# Patient Record
Sex: Female | Born: 1935 | Race: White | Hispanic: No | Marital: Married
Health system: Southern US, Community
[De-identification: ages and names within clinical notes are randomized; demographics above are authoritative.]

---

## 2005-04-18 ENCOUNTER — Inpatient Hospital Stay (HOSPITAL_COMMUNITY): Admission: AD | Admit: 2005-04-18 | Discharge: 2005-04-21 | Payer: Self-pay | Admitting: Nephrology

## 2011-01-19 ENCOUNTER — Encounter: Payer: Self-pay | Admitting: Internal Medicine

## 2015-07-15 ENCOUNTER — Other Ambulatory Visit: Payer: Self-pay

## 2019-01-23 ENCOUNTER — Inpatient Hospital Stay (HOSPITAL_COMMUNITY)
Admission: AD | Admit: 2019-01-23 | Discharge: 2019-01-29 | DRG: 064 | Disposition: E | Payer: Medicare Other | Source: Other Acute Inpatient Hospital | Attending: Pulmonary Disease | Admitting: Pulmonary Disease

## 2019-01-23 ENCOUNTER — Inpatient Hospital Stay (HOSPITAL_COMMUNITY): Payer: Medicare Other

## 2019-01-23 DIAGNOSIS — Z515 Encounter for palliative care: Secondary | ICD-10-CM | POA: Diagnosis not present

## 2019-01-23 DIAGNOSIS — R402233 Coma scale, best verbal response, inappropriate words, at hospital admission: Secondary | ICD-10-CM | POA: Diagnosis not present

## 2019-01-23 DIAGNOSIS — Z8249 Family history of ischemic heart disease and other diseases of the circulatory system: Secondary | ICD-10-CM | POA: Diagnosis not present

## 2019-01-23 DIAGNOSIS — R402352 Coma scale, best motor response, localizes pain, at arrival to emergency department: Secondary | ICD-10-CM | POA: Diagnosis present

## 2019-01-23 DIAGNOSIS — R402353 Coma scale, best motor response, localizes pain, at hospital admission: Secondary | ICD-10-CM | POA: Diagnosis not present

## 2019-01-23 DIAGNOSIS — Z4659 Encounter for fitting and adjustment of other gastrointestinal appliance and device: Secondary | ICD-10-CM

## 2019-01-23 DIAGNOSIS — F329 Major depressive disorder, single episode, unspecified: Secondary | ICD-10-CM | POA: Diagnosis not present

## 2019-01-23 DIAGNOSIS — I10 Essential (primary) hypertension: Secondary | ICD-10-CM | POA: Diagnosis not present

## 2019-01-23 DIAGNOSIS — I639 Cerebral infarction, unspecified: Secondary | ICD-10-CM | POA: Diagnosis present

## 2019-01-23 DIAGNOSIS — R29735 NIHSS score 35: Secondary | ICD-10-CM | POA: Diagnosis not present

## 2019-01-23 DIAGNOSIS — I63533 Cerebral infarction due to unspecified occlusion or stenosis of bilateral posterior cerebral arteries: Secondary | ICD-10-CM | POA: Diagnosis present

## 2019-01-23 DIAGNOSIS — Z803 Family history of malignant neoplasm of breast: Secondary | ICD-10-CM | POA: Diagnosis not present

## 2019-01-23 DIAGNOSIS — Z66 Do not resuscitate: Secondary | ICD-10-CM | POA: Diagnosis present

## 2019-01-23 DIAGNOSIS — J9600 Acute respiratory failure, unspecified whether with hypoxia or hypercapnia: Secondary | ICD-10-CM

## 2019-01-23 DIAGNOSIS — J690 Pneumonitis due to inhalation of food and vomit: Secondary | ICD-10-CM

## 2019-01-23 DIAGNOSIS — R4182 Altered mental status, unspecified: Secondary | ICD-10-CM | POA: Diagnosis present

## 2019-01-23 DIAGNOSIS — H269 Unspecified cataract: Secondary | ICD-10-CM | POA: Diagnosis present

## 2019-01-23 DIAGNOSIS — D696 Thrombocytopenia, unspecified: Secondary | ICD-10-CM

## 2019-01-23 DIAGNOSIS — G9341 Metabolic encephalopathy: Secondary | ICD-10-CM | POA: Diagnosis present

## 2019-01-23 DIAGNOSIS — R509 Fever, unspecified: Secondary | ICD-10-CM

## 2019-01-23 DIAGNOSIS — K219 Gastro-esophageal reflux disease without esophagitis: Secondary | ICD-10-CM | POA: Diagnosis present

## 2019-01-23 DIAGNOSIS — R0689 Other abnormalities of breathing: Secondary | ICD-10-CM

## 2019-01-23 DIAGNOSIS — J9690 Respiratory failure, unspecified, unspecified whether with hypoxia or hypercapnia: Secondary | ICD-10-CM

## 2019-01-23 LAB — COMPREHENSIVE METABOLIC PANEL
ALBUMIN: 2.9 g/dL — AB (ref 3.5–5.0)
ALT: 11 U/L (ref 0–44)
AST: 28 U/L (ref 15–41)
Alkaline Phosphatase: 52 U/L (ref 38–126)
Anion gap: 9 (ref 5–15)
BUN: 20 mg/dL (ref 8–23)
CO2: 20 mmol/L — ABNORMAL LOW (ref 22–32)
CREATININE: 1.03 mg/dL — AB (ref 0.44–1.00)
Calcium: 8.5 mg/dL — ABNORMAL LOW (ref 8.9–10.3)
Chloride: 113 mmol/L — ABNORMAL HIGH (ref 98–111)
GFR calc Af Amer: 58 mL/min — ABNORMAL LOW (ref >60)
GFR calc non Af Amer: 50 mL/min — ABNORMAL LOW (ref >60)
GLUCOSE: 118 mg/dL — AB (ref 70–99)
Potassium: 4 mmol/L (ref 3.5–5.1)
Sodium: 142 mmol/L (ref 135–145)
Total Bilirubin: 1.8 mg/dL — ABNORMAL HIGH (ref 0.3–1.2)
Total Protein: 5.2 g/dL — ABNORMAL LOW (ref 6.5–8.1)

## 2019-01-23 LAB — POCT I-STAT 7, (LYTES, BLD GAS, ICA,H+H)
Acid-base deficit: 8 mmol/L — ABNORMAL HIGH (ref 0.0–2.0)
Bicarbonate: 18 mmol/L — ABNORMAL LOW (ref 20.0–28.0)
Calcium, Ion: 1.3 mmol/L (ref 1.15–1.40)
HCT: 42 % (ref 36.0–46.0)
Hemoglobin: 14.3 g/dL (ref 12.0–15.0)
O2 Saturation: 94 %
PCO2 ART: 36.8 mmHg (ref 32.0–48.0)
PO2 ART: 78 mmHg — AB (ref 83.0–108.0)
Patient temperature: 98
Potassium: 3.6 mmol/L (ref 3.5–5.1)
Sodium: 141 mmol/L (ref 135–145)
TCO2: 19 mmol/L — ABNORMAL LOW (ref 22–32)
pH, Arterial: 7.296 — ABNORMAL LOW (ref 7.350–7.450)

## 2019-01-23 LAB — CBC WITH DIFFERENTIAL/PLATELET
Abs Immature Granulocytes: 0.01 10*3/uL (ref 0.00–0.07)
Basophils Absolute: 0 10*3/uL (ref 0.0–0.1)
Basophils Relative: 0 %
EOS PCT: 0 %
Eosinophils Absolute: 0 10*3/uL (ref 0.0–0.5)
HCT: 50.8 % — ABNORMAL HIGH (ref 36.0–46.0)
HEMOGLOBIN: 16 g/dL — AB (ref 12.0–15.0)
Immature Granulocytes: 0 %
Lymphocytes Relative: 11 %
Lymphs Abs: 0.4 10*3/uL — ABNORMAL LOW (ref 0.7–4.0)
MCH: 28.7 pg (ref 26.0–34.0)
MCHC: 31.5 g/dL (ref 30.0–36.0)
MCV: 91.2 fL (ref 80.0–100.0)
Monocytes Absolute: 0.3 10*3/uL (ref 0.1–1.0)
Monocytes Relative: 8 %
Neutro Abs: 3 10*3/uL (ref 1.7–7.7)
Neutrophils Relative %: 81 %
Platelets: 105 10*3/uL — ABNORMAL LOW (ref 150–400)
RBC: 5.57 MIL/uL — ABNORMAL HIGH (ref 3.87–5.11)
RDW: 12.6 % (ref 11.5–15.5)
WBC Morphology: INCREASED
WBC: 3.7 10*3/uL — ABNORMAL LOW (ref 4.0–10.5)
nRBC: 0 % (ref 0.0–0.2)

## 2019-01-23 LAB — PROTIME-INR
INR: 1.01
Prothrombin Time: 13.2 seconds (ref 11.4–15.2)

## 2019-01-23 LAB — TRIGLYCERIDES: Triglycerides: 50 mg/dL (ref <150)

## 2019-01-23 LAB — MRSA PCR SCREENING: MRSA by PCR: NEGATIVE

## 2019-01-23 LAB — APTT: aPTT: 27 seconds (ref 24–36)

## 2019-01-23 MED ORDER — GLYCOPYRROLATE 0.2 MG/ML IJ SOLN: 0.2000 mg | INTRAMUSCULAR | Status: DC | PRN

## 2019-01-23 MED ORDER — ACETAMINOPHEN 325 MG PO TABS: 650.0000 mg | ORAL_TABLET | Freq: Four times a day (QID) | ORAL | Status: DC | PRN

## 2019-01-23 MED ORDER — PROPOFOL 1000 MG/100ML IV EMUL: 0.0000 ug/kg/min | INTRAVENOUS | Status: DC

## 2019-01-23 MED ORDER — FENTANYL CITRATE (PF) 100 MCG/2ML IJ SOLN: 50.0000 ug | INTRAMUSCULAR | Status: DC | PRN

## 2019-01-23 MED ORDER — DIPHENHYDRAMINE HCL 50 MG/ML IJ SOLN: 25.0000 mg | INTRAMUSCULAR | Status: DC | PRN

## 2019-01-23 MED ORDER — ACETAMINOPHEN 650 MG RE SUPP: 650.0000 mg | Freq: Four times a day (QID) | RECTAL | Status: DC | PRN

## 2019-01-23 MED ORDER — MORPHINE 100MG IN NS 100ML (1MG/ML) PREMIX INFUSION: 1.0000 mg/h | INTRAVENOUS | Status: DC

## 2019-01-23 MED ORDER — ASPIRIN EC 325 MG PO TBEC: 325.0000 mg | DELAYED_RELEASE_TABLET | Freq: Every day | ORAL | Status: DC

## 2019-01-23 MED ORDER — SODIUM CHLORIDE 0.9 % IV SOLN: INTRAVENOUS | Status: DC

## 2019-01-23 MED ORDER — DEXTROSE 5 % IV SOLN: INTRAVENOUS | Status: DC

## 2019-01-23 MED ORDER — ATORVASTATIN CALCIUM 80 MG PO TABS: 80.0000 mg | ORAL_TABLET | Freq: Every day | ORAL | Status: DC

## 2019-01-23 MED ORDER — CHLORHEXIDINE GLUCONATE 0.12% ORAL RINSE (MEDLINE KIT): 15.0000 mL | Freq: Two times a day (BID) | OROMUCOSAL | Status: DC

## 2019-01-23 MED ORDER — MORPHINE 100MG IN NS 100ML (1MG/ML) PREMIX INFUSION
1.0000 mg/h | INTRAVENOUS | Status: DC
  Administered 2019-01-23: 5 mg/h via INTRAVENOUS
  Administered 2019-01-24 – 2019-01-25 (×2): 6 mg/h via INTRAVENOUS
  Filled 2019-01-23 (×4): qty 100

## 2019-01-23 MED ORDER — GLYCOPYRROLATE 1 MG PO TABS
1.0000 mg | ORAL_TABLET | ORAL | Status: DC | PRN
  Filled 2019-01-23: qty 1

## 2019-01-23 MED ORDER — FAMOTIDINE IN NACL 20-0.9 MG/50ML-% IV SOLN: 20.0000 mg | Freq: Two times a day (BID) | INTRAVENOUS | Status: DC

## 2019-01-23 MED ORDER — IOPAMIDOL (ISOVUE-370) INJECTION 76%
100.0000 mL | Freq: Once | INTRAVENOUS | Status: AC | PRN
  Administered 2019-01-23: 100 mL via INTRAVENOUS

## 2019-01-23 MED ORDER — POLYVINYL ALCOHOL 1.4 % OP SOLN
1.0000 [drp] | Freq: Four times a day (QID) | OPHTHALMIC | Status: DC | PRN
  Filled 2019-01-23: qty 15

## 2019-01-23 MED ORDER — ORAL CARE MOUTH RINSE: 15.0000 mL | OROMUCOSAL | Status: DC

## 2019-01-23 NOTE — Progress Notes (Signed)
Pt transported to CT and to MRI and back to 4n31 without complications.

## 2019-01-23 NOTE — Progress Notes (Signed)
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 I explained that there was no  chance for a meaningful recovery for her mother. Carolyn Torres, the patient's daughter is very ill at present with what she thinks is the flu. She has fever, N,V, D. She states she is too ill to leave her home and come to see her mother. She has no other siblings. Carolyn Torres states that she makes medical decisions for her mother.She has an elderly aunt who is 91 years old  and in poor health, and is too sick and has no way of coming to visit the patient.    Carolyn Torres had spoken with Dr. Laurence Torres , neurology 1/ 26 and they had discussed comfort care in light of her mothers high risk of dying due to coma, complications of ICU stay , intubation, respiratory failure and cardiac failure. Carolyn Torres is in agreement with this even though she is unable to come to the hospital.  Carolyn Torres , RN and I  Have had extensive discussions  with Carolyn Torres about  what making her mother a DNR means, and  what comfort care means. We explained that once her mother appears comfortable on a morphine gtt, she will be extubated, removed from life support and allowed to pass away. We will not do CPR, we will not defibrillate her or administer life saving medications. Carolyn Torres verbalized that she understands this, and that this is her wish. She does not want her mother to linger because Carolyn Torres is too sick to come to the hospital to make the arrangements face to face. The patient has had funeral arrangements pre-arranged. Nursing will call Carolyn Torres and keep her updated.   Once patient has been examined by Dr. Delton Torres, we will change code status to DNR and place comfort care orders.  I spent 20 minutes on the phone with Carolyn Torres discussing goals of care to allow for alignment of goals of care with plan of care moving  forward.   Carolyn Torres, Carolyn Torres [facility] Pulmonary/Critical Care Medicine Pager # [phone] After 3 pm call [phone] .

## 2019-01-23 NOTE — Procedures (Signed)
Extubation Procedure Note  Patient Details:   Name: Carolyn Torres DOB: 1932-02-01 MRN: [id number]   Airway Documentation:    Vent end date: 2019/01/31 Vent end time: 1605   Evaluation  O2 sats: currently acceptable Complications: No apparent complications Patient did tolerate procedure well. Bilateral Breath Sounds: Diminished, Clear   No   Pt terminally extubated per MD order.  RN at bedside.  Pt on morphine drip for comfort.  Ronny Flurry 01/31/19, 4:29 PM

## 2019-01-23 NOTE — Progress Notes (Signed)
Wasted 200 mL of Fentanyl that came with patient from [place], witnessed by Rock, Charity fundraiser

## 2019-01-23 NOTE — Progress Notes (Signed)
Patient terminally extubated per MD order at 1605.  Morphine gtt initiated 30 minutes prior.  Daughter, Malaia Colee, notified at [phone] that patient had been extubated.  She was very thankful and appreciative of keeping her informed.

## 2019-01-23 NOTE — Consult Note (Addendum)
Requesting Physician: Dr. Mateo FlowAljishi Wael    Chief Complaint: Unresponsive  History obtained from: Patient and Chart    HPI:                                                                                                                                       Carolyn SearingMartha B Torres is an 85 y.o. female with past medical history of hypertension, depression transferred [facility] for further management of stroke.  She was obtained by daughter over the phone as well as chart review as patient is intubated and unresponsive.  Per chart review patient presented [facility] department after daughter of the patient saw her gurgling and choking secretions in her bed.  She last spoke to her mother at 8 AM when patient was lying in bed chewing gum and asked her to pass the tissue.  Around 10 -11 AM, she saw the patient lying in bed as well but not speaking, but thought she just was not feeling like talking.  During the afternoon when she went to check up on her mother since he was still in bed, she felt something was not right and noticed was gurgling and choking on her secretions.   When EMS arrived and brought the patient, patient was in respiratory distress and not moving.  Also noted to be incontinent of urine.  Arrival to [facility] ED, patient had blown pupil on the right.  Patient was intubated for airway protection.  She was evaluated by [facility].  A stat CT head was obtained which showed right PCA infarct.  She did not receive IV TPA she was outside the window.  She was felt not to be an interventional candidate as she was thought to have established right PCA infarct.  Tele-neurology felt patient may have had a seizure to explain for dilated pupil and an unresponsive state.  Neurology at [facility] ( myself) called to transfer the patient for further management as [facility] did not have inpatient neurology and MRI over the weekend.  Accepted the transfer after  confirming patient was seen by tele neurology was told patient was not an interventional candidate. Reason for intubation was because she aspirated and unaware of blown pupil.  Patient was transferred to [facility] ICU with PCCM as primary.   [facility], patient was still comatose and had bilateral fixed and dilated pupils without being on any sedation.  Due to concern that her clinical picture did not match description for right PCA stroke, immediately took the patient for stat CT and CT angiogram.  Stat CT showed bilateral PCA infarcts and bilateral thalamic infarcts and CT Angiogram showed top of the basilar occlusion.  A stat MRI showed extensive bilateral thalamic infarcts, midbrain infarcts in bilateral PCA infarcts.   Date last known well:  Time last known well:  tPA Given: no, outside window  NIHSS: 32  Baseline MRS : not known at this time    PMH : as above in HPI  FH: no significant family history pertinent for cause of  stroke at this age Social History:  has no history on file for tobacco, alcohol, and drug.  Allergies: Allergies not on file  Medications:                                                                                                                        I reviewed home medications   ROS:                                                                                                                                     Unable to obtain due to mental status   Examination:                                                                                                      General: Appears well-developed . Psych: Comatose Eyes: No scleral injection HENT: No OP obstrucion Head: Normocephalic.  Cardiovascular: Normal rate and regular rhythm.  Respiratory: Effort normal and breath sounds normal to anterior ascultation GI: Soft.  No distension. There is no tenderness.  Skin: WDI    Neurological Examination Mental Status: Patient does  not respond to verbal stimuli.  Does not respond to deep sternal rub.  Does not follow commands.  No verbalizations noted.  Cranial Nerves: II: patient does not respond confrontation bilaterally, Pupils bilaterally dilated and fixed  III,IV,VI: doll's response absent bilaterally.  V,VII: corneal reflex: present bilaterally VIII: patient does not respond to verbal stimuli IX,X: gag reflex:  present XI: trapezius strength unable to test bilaterally XII: tongue strength unable to test Motor: Withdraws over right upper extremity and bilateral lower extremities, no spontaneous movement seen Sensory: Does not respond to noxious stimuli in any extremity. Deep Tendon Reflexes:  Absent throughout. Plantars:  Cerebellar: Unable to perform   Lab Results: Basic Metabolic Panel: Recent Labs  Lab  01/10/2019 0202  NA 141  K 3.6    CBC: Recent Labs  Lab 01/03/2019 0202  HGB 14.3  HCT 42.0    Coagulation Studies: No results for input(s): LABPROT, INR in the last 72 hours.  Imaging: Dg Chest Port 1 View  Result Date: 01/14/2019 CLINICAL DATA:  Intubation EXAM: PORTABLE CHEST 1 VIEW PORTABLE ABDOMEN 1 VIEW COMPARISON:  01/22/2019 at 6:33 p.m. FINDINGS: Endotracheal tube tip is 2.2 cm above the inferior margin of the carina. Orogastric tube tip and side port project over the stomach. Unchanged bibasilar atelectasis. Mild cardiomegaly. No dilated small bowel. Right hip hemiarthroplasty. IMPRESSION: Endotracheal tube tip 2.2 cm above the inferior margin of the carina. Orogastric tube tip and side port project over the stomach. Electronically Signed   By: Deatra Robinson M.D.   On: 01/03/2019 01:27   Dg Abd Portable 1v  Result Date: 01/03/2019 CLINICAL DATA:  Intubation EXAM: PORTABLE CHEST 1 VIEW PORTABLE ABDOMEN 1 VIEW COMPARISON:  01/22/2019 at 6:33 p.m. FINDINGS: Endotracheal tube tip is 2.2 cm above the inferior margin of the carina. Orogastric tube tip and side port project over the stomach.  Unchanged bibasilar atelectasis. Mild cardiomegaly. No dilated small bowel. Right hip hemiarthroplasty. IMPRESSION: Endotracheal tube tip 2.2 cm above the inferior margin of the carina. Orogastric tube tip and side port project over the stomach. Electronically Signed   By: Deatra Robinson M.D.   On: 01/24/2019 01:27     ASSESSMENT AND PLAN  85 y.o. female with past medical history of hypertension presented with fixed right pupil, unresponsive at [place] ED. Evaluated by teleneurology and transferred to [facility] for further management.   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 - Likely recommend comfort care at this point.  Spoke to daughter over the phone who appears to be in agreement.    Patient is neurologically critically ill due to basilar artery occlusion resulting in stents of infarctions.  Patient is high risk of dying due to coma, complications of ICU stay and intubation, respiratory failure and cardiac failure.  I spent 60 minutes in the care of this neurologically ill patient.   Nzinga Ferran [facility] Pager Number [phone]

## 2019-01-23 NOTE — H&P (Signed)
PULMONARY / CRITICAL CARE MEDICINE   NAME:  Carolyn SearingMartha B Torres, MRN:  [id number], DOB:  02-21-1932, LOS: 0 ADMISSION DATE:  2019/08/16, CONSULTATION DATE: 2019/08/16 REFERRING MD: [facility] room physician, CHIEF COMPLAINT: Altered mental status  BRIEF HISTORY:    85 year old lady coming in with altered mental status and known downtime CT scan suggestive of posterior CVA now on mechanical ventilation for airway protection  HISTORY OF PRESENT ILLNESS   85 year old lady with history of hypertension was brought in [facility] after presenting to the emergency room department with unresponsiveness and full blown pupils according to EMS patient was last found normal at 10 AM and hours over 6 hours from the time of presentation [facility] patient was not a candidate for TPA CT scan of the head was suggestive of posterior CVA and due to altered mental status and Glascow coma scale of 8 patient was intubated for airway protection.  Neurology were consulted and is advised to be transferred to [facility] for further management but will be a conservative management.  Patient was brought into our intensive care when I evaluated patient patient was only on 10 mics of fentanyl withdrawing to pain in her left leg still has no pupil reflexes but has cataracts and does have a gag reflex and breathes over the ventilator.  No family around to take any history and most of the history was taken from the chart.   SIGNIFICANT PAST MEDICAL HISTORY   -Hypertension -Depression -History of hernia repair   SIGNIFICANT EVENTS:  Endotracheal intubation 01/22/2019 STUDIES:   CT head 01/22/2019 suggestive of possible acute posterior CVA CULTURES:  None  ANTIBIOTICS:  None  LINES/TUBES:   ET tube 01/22/2019  CONSULTANTS:  Neurology SUBJECTIVE:    CONSTITUTIONAL: BP (!) 109/96   Pulse (!) 59   Resp 15   Ht 5\' 1"  (1.549 m)   SpO2 (!) 79%   No intake/output data recorded.     Vent Mode:  PRVC FiO2 (%):  [50 %] 50 % Set Rate:  [14 bmp] 14 bmp Vt Set:  [380 mL] 380 mL PEEP:  [5 cmH20] 5 cmH20 Plateau Pressure:  [14 cmH20] 14 cmH20  PHYSICAL EXAM: General: Acutely ill intubated Neuro: Withdraws to pain and lower extremities does have a gag and breathes over the ventilator pupils are fixed dilated but does have cataract HEENT: Endotracheal tube in position Cardiovascular: Normal heart sound no added sounds or murmurs Lungs: Clear equal air sounds bilateral no crackles no wheezing Abdomen: Soft no tenderness no organomegaly Musculoskeletal: No lower limb edema Skin: No rash  RESOLVED PROBLEM LIST   ASSESSMENT AND PLAN   Assessment: -Acute CVA/posterior circulation -Acute metabolic encephalopathy mostly secondary to acute stroke -Acute respiratory insufficiency requiring mechanical ventilation for airway protection   Plan: -Admit to the intensive care unit for further management -Adjust mechanical ventilation and use lung protective strategy keep pulse ox above 94% -ABG in 1 hour -Stat chest x-ray to assess endotracheal tube position -Discontinue sedation to assess neurological status -Neurology consult -Aspirin -Patient is passed TPA window and not for any intervention as per neurology will manage conservatively -Permissive hypertension allow blood pressure systolic up to 180 -Brain imaging as per neurology  I have spent 45 minutes of critical care time this time was spent bedside or in the unit this time was exclusive of any billable procedures patient is needing intensive care unit due to respiratory sufficiency require mechanical ventilation  SUMMARY OF TODAY'S PLAN:    Best Practice /  Goals of Care / Disposition.   DVT PROPHYLAXIS: SCDs sUP: PPI NUTRITION: N.p.o. MOBILITY: Bedrest GOALS OF CARE: Full code FAMILY DISCUSSIONS: No family present DISPOSITION ICU admission  LABS  WBC: 10.5 Hemoglobin 15.9 Platelets 176 Sodium 138 Potassium  4.3 Chloride 104 Bicarb 23 BUN 18 Creatinine 0.9  PAST MEDICAL HISTORY :   -Hypertension -Gastroesophageal reflux disease -Depression.  FAMILY HISTORY:   Could not be obtained due to patient altered mental status witnessed per reports patient sister had breast cancer father had cardiac disease  SOCIAL HISTORY:  Could not be obtained due to patient altered mental status  REVIEW OF SYSTEMS:    Could not be obtained due to patient altered mental status

## 2019-01-23 NOTE — Progress Notes (Signed)
STROKE TEAM PROGRESS NOTE   SUBJECTIVE (INTERVAL HISTORY) Her RN is at the bedside.  Daughter cannot be here as daughter was sick at home.  CCM has discussed with daughter over the phone and she was requesting for comfort care measures.   OBJECTIVE Vitals:   08/16/2019 0400 08/16/2019 0500 08/16/2019 0600 08/16/2019 0700  BP: (!) 101/55 (!) 100/56 (!) 113/54 (!) 118/56  Pulse: 62 63 60 62  Resp: (!) 21 19 16  (!) 22  Temp: 98.8 F (37.1 C) 99.3 F (37.4 C) 99.7 F (37.6 C) 99.9 F (37.7 C)  SpO2: 95% 95% 97% 96%  Weight: 51.9 kg     Height:        CBC:  Recent Labs  Lab 08/16/2019 0129 08/16/2019 0202  WBC 3.7*  --   NEUTROABS 3.0  --   HGB 16.0* 14.3  HCT 50.8* 42.0  MCV 91.2  --   PLT 105*  --     Basic Metabolic Panel:  Recent Labs  Lab 08/16/2019 0129 08/16/2019 0202  NA 142 141  K 4.0 3.6  CL 113*  --   CO2 20*  --   GLUCOSE 118*  --   BUN 20  --   CREATININE 1.03*  --   CALCIUM 8.5*  --     Lipid Panel:     Component Value Date/Time   TRIG 50 08/18/202020 0129   HgbA1c: No results found for: HGBA1C Urine Drug Screen: No results found for: LABOPIA, COCAINSCRNUR, LABBENZ, AMPHETMU, THCU, LABBARB  Alcohol Level No results found for: ETH  IMAGING  Ct Angio Head W Or Wo Contrast Ct Angio Neck W Or Wo Contrast Ct Head Wo Contrast 02/21/19 IMPRESSION:  1. 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  2. Proximal occlusion of the left vertebral artery extending to the mid V2 segment. The remainder of the left vertebral artery is diminutive, but patent.  3. Patent, non-stenotic anterior circulation.  4. No hemorrhage.    Mr Brain Wo Contrast 02/21/19 IMPRESSION:  1. Multifocal bilateral posterior circulation acute infarcts, greatest in the temporal and occipital lobes with small areas in both thalami, the brainstem and cerebellum.  2. No  mass effect or acute hemorrhage.  3. Chronic ischemic microangiopathy.    Dg Chest Port 1 View 02/21/19 IMPRESSION:  Endotracheal tube tip 2.2 cm above the inferior margin of the carina. Orogastric tube tip and side port project over the stomach.   Dg Abd Portable 1v 02/21/19 IMPRESSION:  Endotracheal tube tip 2.2 cm above the inferior margin of the carina. Orogastric tube tip and side port project over the stomach.    PHYSICAL EXAM  Temp:  [97.7 F (36.5 C)-100.8 F (38.2 C)] 100.8 F (38.2 C) (01/26 1000) Pulse Rate:  [55-76] 76 (01/26 1000) Resp:  [15-25] 25 (01/26 1000) BP: (100-151)/(54-96) 151/72 (01/26 1000) SpO2:  [79 %-98 %] 98 % (01/26 1000) FiO2 (%):  [50 %] 50 % (01/26 1000) Weight:  [51.9 kg] 51.9 kg (01/26 0400)  General - Well nourished, well developed, intubated not on sedation.  Ophthalmologic - fundi not visualized due to noncooperation.  Cardiovascular - Regular rate and rhythm.  Neuro - intubated not on sedation, eyes closed, not following commands. With forced eye opening, eyes in mid position, not blinking to visual threat, doll's eyes absent, not tracking, pupil 6 mm bilaterally, not reactive to light. Corneal reflex bilaterally weak, gag reflex  weak. Breathing over the vent.  Facial symmetry not able to test due to ET tube.  Tongue midline in mouth. On central pain stimulation, patient had mild decerebrate posturing.  On individual extremity stimulation with painful stimuli, patient had slight withdrawal in all extremities.  DTR 1+ and bilateral positive Babinski. Sensation, coordination and gait not tested.   ASSESSMENT/PLAN Carolyn Torres is a 85 y.o. female with history of hypertension and depression presenting unresponsiveness. She did not receive IV t-PA due to late presentation.  Stroke: bilateral large PCAs, thalamus, midbrain, cerebellum and hypothalamus infarcts, due to top of basilar artery occlusion, embolic pattern, source  unclear.  Resultant unresponsive with decerebrate posturing  CT head - bilateral occipital infarcts and symmetric thalamic infarcts.   MRI head - Multifocal bilateral posterior circulation acute infarcts, greatest in the temporal and occipital lobes with small areas in both thalami, the brainstem and cerebellum  CTA H&N - Basilar tip occlusion, resulting in occlusion of both posterior cerebral arteries. Proximal occlusion of the left vertebral artery extending to the mid V2 segment  VTE prophylaxis - SCDs  Diet - NPO  No antithrombotic prior to admission, now on aspirin 325 mg daily  Disposition:  Poor prognosis, CCM discussed with daughter over the phone, daughter requested comfort care measures.  Respiratory failure  Intubated for airway protection  CCM on board  Daughter requested comfort cares  We will likely terminal extubated  Aspiration pneumonia  Initial presentation  Intubated for airway protection  T-max 100.8  Tylenol as needed  Comfort care measures  Hypertension  Blood pressure somewhat low  . Not on BP meds . On IVF . Will have comfort care measure  Other Stroke Risk Factors  Advanced age  Other Active Problems  Thrombocytopenia plts - 105   Hospital day # 0  This patient is critically ill due to bilateral posterior circulation extensive infarcts, basal artery occlusion, respiratory failure and at significant risk of neurological worsening, death form recurrent stroke, cerebral edema, brain herniation, brain death, heart failure. This patient's care requires constant monitoring of vital signs, hemodynamics, respiratory and cardiac monitoring, review of multiple databases, neurological assessment, discussion with family, other specialists and medical decision making of high complexity. I spent 35 minutes of neurocritical care time in the care of this patient.  Neurology will sign off. Please call with questions. Thanks for the  consult.  Marvel PlanJindong Jniya Madara, MD PhD Stroke Neurology 03-Jul-2019 12:10 PM  To contact Stroke Continuity provider, please refer to [email or web address]. After hours, contact General Neurology

## 2019-01-23 NOTE — Progress Notes (Signed)
Spoke with Maxcine Ham, daughter of patient, she is the only decision making individual for patient.  Patient's daughter is sick with flu has nausea, vomiting and diarrhea.  She's unable to come to hospital and speak with medical staff.  She spoke with Kandice Robinsons, NP for CCM over the phone.  After discussing patients prognosis, Simmie Balthazor has made the decision to make her mother a DNR and place her under comfort care.  Tarryn Bracken fully understands that her mother will likely pass away once patient is removed from life support.  I confirmed her decision making over the phone and she fully understands.

## 2019-01-23 NOTE — Progress Notes (Signed)
eLink Physician-Brief Progress Note Patient Name: Carolyn Torres DOB: [birth year] MRN: [id number]   Date of Service  2019/02/07  HPI/Events of Note  Patient on comfort care. Request to transfer to palliative care bed.   eICU Interventions  Will order: 1. Transfer to palliative care bed. 2. Nurse may pronounce.      Intervention Category Major Interventions: Other:  Lenell Antu 02/07/19, 9:39 PM

## 2019-01-23 NOTE — Progress Notes (Signed)
Patient arrived to unit. Transferred patient from bed to bed sliding with assist of 4 staff.

## 2019-01-23 NOTE — Progress Notes (Signed)
Brief Progress Note  Pt. Admitted from [facility] 1/26 early am for Bilateral  PCA infarct and bilateral thalamic infarcts.  CT Angiogram showed top of the basilar occlusion.  A stat MRI showed extensive bilateral thalamic infarcts, midbrain infarcts in bilateral PCA infarcts. >> No TPA as no known last time normal. IR was not attempted as given extensive involvement of stroke already seen on CT scan and MRI Arlys John that attempting  thrombectomy of basilar thrombus would not significantly improve patients deficits  and therefore patient not a candidate for IR.  Plan is most likely will be comfort care once daughter arrives. She daughter is sick, thinks she has the flu so she has not been to see her mother since admission.   Physical Exam General:  Psych: Unresponsive/ comatose Eyes: No scleral injection, pupils are 4 mm and non-reactive HENT:No LAD,No JVD, Oral ETT secure at 21 at the lip Head: NCAT,  Oral ETT, OG tube Cardiovascular: S1, S2, RRR, No RMG  Respiratory:Bilateral Chest excursion, clear trhoughout, few rhonchi, diminished per bases GI: Soft. No distension. There is no tenderness. BS + Skin: WDI, no mottling, no lesions or rash  Remains on 50%, 5 PEEP, Rate of 16, TV of 380 Off sedation , but no documentation  Of when fentanyl was stopped.  Plan: Continue to support with MV until goals of care can be determined. No weaning Temp is 100.4, will add Tylenol for temp > 101.5 CXR on 1/27 Labs in am ABG prn Permissive HTN to 180 Additional Imaging per Neuro   Bevelyn Ngo, AGACNP-BC [facility] Pulmonary/Critical Care Medicine Pager # [phone] After 3 pm call [phone]  02-19-2019 9:03 AM

## 2019-01-24 NOTE — Progress Notes (Signed)
PULMONARY / CRITICAL CARE MEDICINE   NAME:  Carolyn Torres, MRN:  [id number], DOB:  03/12/1932, LOS: 1 ADMISSION DATE:  02-03-2019, CONSULTATION DATE: 2019/02/03 REFERRING MD: Duke Salvia emergency room physician, CHIEF COMPLAINT: Altered mental status  BRIEF HISTORY:    85 year old lady admitted with altered mental status and known downtime CT scan suggestive of posterior CVA now on mechanical ventilation for airway protection.  The patient was unfortunately passed the tPA window and not a candidate for further interventions.    SIGNIFICANT PAST MEDICAL HISTORY   -Hypertension -Depression -History of hernia repair  SIGNIFICANT EVENTS:  1/25  Presented to [facility]  1/26  Tx to [facility], comfort care  STUDIES:   CT head 1/25 >> suggestive of possible acute posterior CVA  CULTURES:    ANTIBIOTICS:    LINES/TUBES:  ETT 1/25 >> 1/26   CONSULTANTS:  Neurology  SUBJECTIVE:  No acute events. Pt transferred to medical floor.  CONSTITUTIONAL: BP (!) 99/54 (BP Location: Left Arm)   Pulse 66   Temp 99.5 F (37.5 C) (Axillary)   Resp (!) 8   Ht 5\' 1"  (1.549 m)   Wt 51.9 kg   SpO2 (!) 82%   BMI 21.62 kg/m   I/O last 3 completed shifts: In: 67.6 [I.V.:67.6] Out: 500 [Urine:500]     Vent Mode: PRVC FiO2 (%):  [50 %] 50 % Set Rate:  [16 bmp] 16 bmp Vt Set:  [380 mL] 380 mL PEEP:  [5 cmH20] 5 cmH20 Plateau Pressure:  [13 cmH20-16 cmH20] 16 cmH20  PHYSICAL EXAM: General: elderly female lying in bed in NAD, appears comfortable HEENT: MM pink/moist Neuro: obtunded, no response to voice  CV: s1s2 rrr, no m/r/g PULM: even/non-labored, lungs bilaterally with soft rhonchi  DT:HYHO, non-tender, bsx4 hypoactive  Extremities: warm/dry  Skin: no rashes or lesions  RESOLVED PROBLEM LIST   ASSESSMENT AND PLAN    Acute CVA/posterior circulation Acute metabolic encephalopathy mostly secondary to acute stroke Acute respiratory insufficiency requiring mechanical  ventilation for airway protection  P: Continue comfort care measures  No further labs / XRAY's DNR / DNI  Morphine gtt for comfort PRN robinul for secretions  Anticipate hours to day(s)  Best Practice / Goals of Care / Disposition.   DVT PROPHYLAXIS: n/a  SUP: n/a NUTRITION: NPO. MOBILITY: Bedrest GOALS OF CARE: Full code FAMILY DISCUSSIONS: No family present DISPOSITION comfort care  LABS  None 1/27   Canary Brim, NP-C [facility] Pgr: [phone] or if no answer [phone] 01/24/2019, 8:55 AM

## 2019-01-25 DIAGNOSIS — J9601 Acute respiratory failure with hypoxia: Secondary | ICD-10-CM

## 2019-01-25 LAB — CULTURE, RESPIRATORY W GRAM STAIN
Culture: NORMAL
Special Requests: NORMAL

## 2019-01-25 LAB — CULTURE, RESPIRATORY

## 2019-01-28 ENCOUNTER — Telehealth: Payer: Self-pay | Admitting: Pulmonary Disease

## 2019-01-28 NOTE — Telephone Encounter (Incomplete)
01/28/19 [facility] Everardo AllEllison to sign.I will send by courier to Pulmonary . PWR

## 2019-01-29 NOTE — Death Summary Note (Signed)
DEATH SUMMARY   Patient Details  Name: Carolyn Torres MRN: [id number] DOB: July 10, 1932  Admission/Discharge Information   Admit Date:  02-01-19  Date of Death: Date of Death: 02/03/2019  Time of Death: Time of Death: 0310  Length of Stay: 2  Referring Physician: System, Pcp Not In   Reason(s) for Hospitalization  Stroke  Diagnoses  Preliminary cause of death: Acute respiratory insufficiency secondary to CVA Secondary Diagnoses (including complications and co-morbidities):  Active Problems:   Acute CVA (cerebrovascular accident) [facility])   Acute respiratory insufficiency   Brief Hospital Course (including significant findings, care, treatment, and services provided and events leading to death)  FADIA Torres is a 85 y.o. year old female who initially presented to OSH for unresponsiveness and transferred to [facility] for CT head for bilateral PCA infarct and bilateral thalamic infarcts. She was intubated for acute respiratory insufficiency. MRI demonstrated extensive thalamic infarcts, midbrain infarcts in bilateral PCA infarcts. Patient was not a candidate for fibrinolytics at time of presentation. She was also not a candidate for thrombectomy with of basilar thrombus due to the extensive brain damage and unlikelihood of functional recovery. Goals of care was discussed with family and patient was transitioned to comfort care with compassionate extubation. She expired on 02-03-2019 at 0310.   Pertinent Labs and Studies  Significant Diagnostic Studies Ct Angio Head W Or Wo Contrast  Result Date: 01-Feb-2019 CLINICAL DATA:  Focal neuro deficit, > 6 hrs, stroke suspected; Stroke, follow up EXAM: CT ANGIOGRAPHY HEAD AND NECK TECHNIQUE: Multidetector CT imaging of the head and neck was performed using the standard protocol during bolus administration of intravenous contrast. Multiplanar CT image reconstructions and MIPs were obtained to evaluate the vascular anatomy. Carotid stenosis  measurements (when applicable) are obtained utilizing NASCET criteria, using the distal internal carotid diameter as the denominator. CONTRAST:  ISOVUE-370 IOPAMIDOL (ISOVUE-370) INJECTION 76% COMPARISON:  Head CT 01/22/2018 FINDINGS: CT HEAD FINDINGS Brain: There is no mass, hemorrhage or extra-axial collection. The size and configuration of the ventricles and extra-axial CSF spaces are normal. Hypoattenuation within both medial temporal lobes is more conspicuous than on the prior examination, though there was so much motion on the earlier study that comparison is somewhat limited. There are now bilateral thalamic infarcts in a pattern consistent with occlusion of the artery of Percheron. There is hypoattenuation of the periventricular white matter, most commonly indicating chronic ischemic microangiopathy. Skull: The visualized skull base, calvarium and extracranial soft tissues are normal. Sinuses/Orbits: No fluid levels or advanced mucosal thickening of the visualized paranasal sinuses. No mastoid or middle ear effusion. The orbits are normal. CTA NECK FINDINGS SKELETON: There is no bony spinal canal stenosis. No lytic or blastic lesion. OTHER NECK: Normal pharynx, larynx and major salivary glands. No cervical lymphadenopathy. Unremarkable thyroid gland. UPPER CHEST: Patient is intubated with endotracheal tube tip just below the level of the clavicular heads. Multiple opacities within the left upper and lower lobes. Small right pleural effusion. AORTIC ARCH: There is mild calcific atherosclerosis of the aortic arch. There is no aneurysm, dissection or hemodynamically significant stenosis of the visualized ascending aorta and aortic arch. Conventional 3 vessel aortic branching pattern. The visualized proximal subclavian arteries are widely patent. Dilated main pulmonary artery, incompletely visualized. RIGHT CAROTID SYSTEM: --Common carotid artery: Widely patent origin without common carotid artery  dissection or aneurysm. --Internal carotid artery: No dissection, occlusion or aneurysm. Mild atherosclerotic calcification at the carotid bifurcation without hemodynamically significant stenosis. --External carotid artery: No acute abnormality.  LEFT CAROTID SYSTEM: --Common carotid artery: Widely patent origin without common carotid artery dissection or aneurysm. --Internal carotid artery: No dissection, occlusion or aneurysm. Mild atherosclerotic calcification at the carotid bifurcation without hemodynamically significant stenosis. --External carotid artery: No acute abnormality. VERTEBRAL ARTERIES: Right dominant configuration. Left vertebral artery is occluded at its origin. There are multiple short segments of reconstitution along the V2 segment. Diminutive V3 and V4 segments. Normal right vertebral artery. CTA HEAD FINDINGS POSTERIOR CIRCULATION: --Basilar artery: There is occlusion of the tip of the basilar artery, just distal to the origin of the superior cerebellar arteries, but proximal to the PCA origins. --Posterior cerebral arteries: Both posterior cerebral arteries are occluded proximally. --Superior cerebellar arteries: Normal. --Inferior cerebellar arteries: Normal anterior and posterior inferior cerebellar arteries. ANTERIOR CIRCULATION: --Intracranial internal carotid arteries: Normal. --Anterior cerebral arteries: Normal. Both A1 segments are present. Patent anterior communicating artery. --Middle cerebral arteries: Normal. --Posterior communicating arteries: Absent bilaterally. VENOUS SINUSES: As permitted by contrast timing, patent. ANATOMIC VARIANTS: None DELAYED PHASE: Not performed. Review of the MIP images confirms the above findings. IMPRESSION: 1. 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 2. Proximal occlusion of the left vertebral artery extending to the mid V2  segment. The remainder of the left vertebral artery is diminutive, but patent. 3. Patent, non-stenotic anterior circulation. 4. No hemorrhage. Critical Value/emergent results were called by telephone at the time of interpretation on 01/17/2019 at 3:00 am to Dr. Arther Dames , who verbally acknowledged these results. Electronically Signed   By: Deatra Robinson M.D.   On: 01/19/2019 03:03   Ct Head Wo Contrast  Result Date: 01/10/2019 CLINICAL DATA:  Focal neuro deficit, > 6 hrs, stroke suspected; Stroke, follow up EXAM: CT ANGIOGRAPHY HEAD AND NECK TECHNIQUE: Multidetector CT imaging of the head and neck was performed using the standard protocol during bolus administration of intravenous contrast. Multiplanar CT image reconstructions and MIPs were obtained to evaluate the vascular anatomy. Carotid stenosis measurements (when applicable) are obtained utilizing NASCET criteria, using the distal internal carotid diameter as the denominator. CONTRAST:  ISOVUE-370 IOPAMIDOL (ISOVUE-370) INJECTION 76% COMPARISON:  Head CT 01/22/2018 FINDINGS: CT HEAD FINDINGS Brain: There is no mass, hemorrhage or extra-axial collection. The size and configuration of the ventricles and extra-axial CSF spaces are normal. Hypoattenuation within both medial temporal lobes is more conspicuous than on the prior examination, though there was so much motion on the earlier study that comparison is somewhat limited. There are now bilateral thalamic infarcts in a pattern consistent with occlusion of the artery of P[place]heron. There is hypoattenuation of the periventricular white matter, most commonly indicating chronic ischemic microangiopathy. Skull: The visualized skull base, calvarium and extracranial soft tissues are normal. Sinuses/Orbits: No fluid levels or advanced mucosal thickening of the visualized paranasal sinuses. No mastoid or middle ear effusion. The orbits are normal. CTA NECK FINDINGS SKELETON: There is no bony spinal canal  stenosis. No lytic or blastic lesion. OTHER NECK: Normal pharynx, larynx and major salivary glands. No cervical lymphadenopathy. Unremarkable thyroid gland. UPPER CHEST: Patient is intubated with endotracheal tube tip just below the level of the clavicular heads. Multiple opacities within the left upper and lower lobes. Small right pleural effusion. AORTIC ARCH: There is mild calcific atherosclerosis of the aortic arch. There is no aneurysm, dissection or hemodynamically significant stenosis of the visualized ascending aorta and aortic arch. Conventional 3 vessel aortic branching pattern. The visualized proximal subclavian  arteries are widely patent. Dilated main pulmonary artery, incompletely visualized. RIGHT CAROTID SYSTEM: --Common carotid artery: Widely patent origin without common carotid artery dissection or aneurysm. --Internal carotid artery: No dissection, occlusion or aneurysm. Mild atherosclerotic calcification at the carotid bifurcation without hemodynamically significant stenosis. --External carotid artery: No acute abnormality. LEFT CAROTID SYSTEM: --Common carotid artery: Widely patent origin without common carotid artery dissection or aneurysm. --Internal carotid artery: No dissection, occlusion or aneurysm. Mild atherosclerotic calcification at the carotid bifurcation without hemodynamically significant stenosis. --External carotid artery: No acute abnormality. VERTEBRAL ARTERIES: Right dominant configuration. Left vertebral artery is occluded at its origin. There are multiple short segments of reconstitution along the V2 segment. Diminutive V3 and V4 segments. Normal right vertebral artery. CTA HEAD FINDINGS POSTERIOR CIRCULATION: --Basilar artery: There is occlusion of the tip of the basilar artery, just distal to the origin of the superior cerebellar arteries, but proximal to the PCA origins. --Posterior cerebral arteries: Both posterior cerebral arteries are occluded proximally. --Superior  cerebellar arteries: Normal. --Inferior cerebellar arteries: Normal anterior and posterior inferior cerebellar arteries. ANTERIOR CIRCULATION: --Intracranial internal carotid arteries: Normal. --Anterior cerebral arteries: Normal. Both A1 segments are present. Patent anterior communicating artery. --Middle cerebral arteries: Normal. --Posterior communicating arteries: Absent bilaterally. VENOUS SINUSES: As permitted by contrast timing, patent. ANATOMIC VARIANTS: None DELAYED PHASE: Not performed. Review of the MIP images confirms the above findings. IMPRESSION: 1. 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 2. Proximal occlusion of the left vertebral artery extending to the mid V2 segment. The remainder of the left vertebral artery is diminutive, but patent. 3. Patent, non-stenotic anterior circulation. 4. No hemorrhage. Critical Value/emergent results were called by telephone at the time of interpretation on Dec 26, 2019 at 3:00 am to Dr. Arther DamesSUSHANTH AROOR , who verbally acknowledged these results. Electronically Signed   By: Deatra RobinsonKevin  Herman M.D.   On: 0Dec 28, 2020 03:03   Ct Angio Neck W Or Wo Contrast  Result Date: Dec 26, 2019 CLINICAL DATA:  Focal neuro deficit, > 6 hrs, stroke suspected; Stroke, follow up EXAM: CT ANGIOGRAPHY HEAD AND NECK TECHNIQUE: Multidetector CT imaging of the head and neck was performed using the standard protocol during bolus administration of intravenous contrast. Multiplanar CT image reconstructions and MIPs were obtained to evaluate the vascular anatomy. Carotid stenosis measurements (when applicable) are obtained utilizing NASCET criteria, using the distal internal carotid diameter as the denominator. CONTRAST:  100mL ISOVUE-370 IOPAMIDOL (ISOVUE-370) INJECTION 76% COMPARISON:  Head CT 01/22/2018 FINDINGS: CT HEAD FINDINGS Brain: There is no mass, hemorrhage or  extra-axial collection. The size and configuration of the ventricles and extra-axial CSF spaces are normal. Hypoattenuation within both medial temporal lobes is more conspicuous than on the prior examination, though there was so much motion on the earlier study that comparison is somewhat limited. There are now bilateral thalamic infarcts in a pattern consistent with occlusion of the artery of Percheron. There is hypoattenuation of the periventricular white matter, most commonly indicating chronic ischemic microangiopathy. Skull: The visualized skull base, calvarium and extracranial soft tissues are normal. Sinuses/Orbits: No fluid levels or advanced mucosal thickening of the visualized paranasal sinuses. No mastoid or middle ear effusion. The orbits are normal. CTA NECK FINDINGS SKELETON: There is no bony spinal canal stenosis. No lytic or blastic lesion. OTHER NECK: Normal pharynx, larynx and major salivary glands. No cervical lymphadenopathy. Unremarkable thyroid gland. UPPER CHEST: Patient is intubated with endotracheal tube tip just below the level of  the clavicular heads. Multiple opacities within the left upper and lower lobes. Small right pleural effusion. AORTIC ARCH: There is mild calcific atherosclerosis of the aortic arch. There is no aneurysm, dissection or hemodynamically significant stenosis of the visualized ascending aorta and aortic arch. Conventional 3 vessel aortic branching pattern. The visualized proximal subclavian arteries are widely patent. Dilated main pulmonary artery, incompletely visualized. RIGHT CAROTID SYSTEM: --Common carotid artery: Widely patent origin without common carotid artery dissection or aneurysm. --Internal carotid artery: No dissection, occlusion or aneurysm. Mild atherosclerotic calcification at the carotid bifurcation without hemodynamically significant stenosis. --External carotid artery: No acute abnormality. LEFT CAROTID SYSTEM: --Common carotid artery: Widely patent  origin without common carotid artery dissection or aneurysm. --Internal carotid artery: No dissection, occlusion or aneurysm. Mild atherosclerotic calcification at the carotid bifurcation without hemodynamically significant stenosis. --External carotid artery: No acute abnormality. VERTEBRAL ARTERIES: Right dominant configuration. Left vertebral artery is occluded at its origin. There are multiple short segments of reconstitution along the V2 segment. Diminutive V3 and V4 segments. Normal right vertebral artery. CTA HEAD FINDINGS POSTERIOR CIRCULATION: --Basilar artery: There is occlusion of the tip of the basilar artery, just distal to the origin of the superior cerebellar arteries, but proximal to the PCA origins. --Posterior cerebral arteries: Both posterior cerebral arteries are occluded proximally. --Superior cerebellar arteries: Normal. --Inferior cerebellar arteries: Normal anterior and posterior inferior cerebellar arteries. ANTERIOR CIRCULATION: --Intracranial internal carotid arteries: Normal. --Anterior cerebral arteries: Normal. Both A1 segments are present. Patent anterior communicating artery. --Middle cerebral arteries: Normal. --Posterior communicating arteries: Absent bilaterally. VENOUS SINUSES: As permitted by contrast timing, patent. ANATOMIC VARIANTS: None DELAYED PHASE: Not performed. Review of the MIP images confirms the above findings. IMPRESSION: 1. 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 2. Proximal occlusion of the left vertebral artery extending to the mid V2 segment. The remainder of the left vertebral artery is diminutive, but patent. 3. Patent, non-stenotic anterior circulation. 4. No hemorrhage. Critical Value/emergent results were called by telephone at the time of interpretation on 02-15-19 at 3:00 am to Dr. Arther Dames , who verbally acknowledged  these results. Electronically Signed   By: Deatra Robinson M.D.   On: 02/15/2019 03:03   Mr Brain Wo Contrast  Result Date: Feb 15, 2019 CLINICAL DATA:  Stroke.  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 EXAM: MRI HEAD WITHOUT CONTRAST TECHNIQUE: Multiplanar, multiecho pulse sequences of the brain and surrounding structures were obtained without intravenous contrast. COMPARISON:  CTA head neck 02-15-2019 FINDINGS: BRAIN: There is extensive bilateral posterior circulation acute ischemia with large areas of ischemic infarct within both medial temporal lobes and left-greater-than-right occipital lobes. Bilateral medial thalamic infarcts and multifocal bilateral cerebellar infarcts as well. No anterior circulation infarct. The midline structures are normal. No midline shift or other mass effect. Mild white matter hyperintensity, most commonly due to chronic ischemic microangiopathy, though not unexpected for age. The cerebral and cerebellar volume are age-appropriate. Susceptibility-sensitive sequences show no chronic microhemorrhage or superficial siderosis. No acute hemorrhage. VASCULAR: Major intracranial arterial and venous sinus flow voids are normal. SKULL AND UPPER CERVICAL SPINE: Calvarial bone marrow signal is normal. There is no skull base mass. Visualized upper cervical spine and soft tissues are normal. SINUSES/ORBITS: Fluid layering within the nasopharynx. Bilateral mastoid effusions. The orbits are normal. IMPRESSION: 1. Multifocal bilateral posterior circulation acute infarcts, greatest in the temporal and occipital lobes with small areas in both thalami, the brainstem and  cerebellum. 2. No mass effect or acute hemorrhage. 3. Chronic ischemic microangiopathy. Electronically Signed   By: Deatra RobinsonKevin  Herman M.D.   On: 01/12/2019 04:23   Dg Chest Port 1 View  Result Date: 01/13/2019 CLINICAL DATA:  Intubation EXAM: PORTABLE CHEST 1 VIEW PORTABLE ABDOMEN 1 VIEW COMPARISON:  01/22/2019 at 6:33 p.m. FINDINGS:  Endotracheal tube tip is 2.2 cm above the inferior margin of the carina. Orogastric tube tip and side port project over the stomach. Unchanged bibasilar atelectasis. Mild cardiomegaly. No dilated small bowel. Right hip hemiarthroplasty. IMPRESSION: Endotracheal tube tip 2.2 cm above the inferior margin of the carina. Orogastric tube tip and side port project over the stomach. Electronically Signed   By: Deatra RobinsonKevin  Herman M.D.   On: 01/07/2019 01:27   Dg Abd Portable 1v  Result Date: 12/31/2018 CLINICAL DATA:  Intubation EXAM: PORTABLE CHEST 1 VIEW PORTABLE ABDOMEN 1 VIEW COMPARISON:  01/22/2019 at 6:33 p.m. FINDINGS: Endotracheal tube tip is 2.2 cm above the inferior margin of the carina. Orogastric tube tip and side port project over the stomach. Unchanged bibasilar atelectasis. Mild cardiomegaly. No dilated small bowel. Right hip hemiarthroplasty. IMPRESSION: Endotracheal tube tip 2.2 cm above the inferior margin of the carina. Orogastric tube tip and side port project over the stomach. Electronically Signed   By: Deatra RobinsonKevin  Herman M.D.   On: 01/04/2019 01:27    Microbiology Recent Results (from the past 240 hour(s))  MRSA PCR Screening     Status: None   Collection Time: 01/13/2019 12:36 AM  Result Value Ref Range Status   MRSA by PCR NEGATIVE NEGATIVE Final    Comment:        The GeneXpert MRSA Assay (FDA approved for NASAL specimens only), is one component of a comprehensive MRSA colonization surveillance program. It is not intended to diagnose MRSA infection nor to guide or monitor treatment for MRSA infections. Performed at [facility], [address] [place], [place] [address]   Culture, respiratory (non-expectorated)     Status: None   Collection Time: 01/21/2019  9:19 AM  Result Value Ref Range Status   Specimen Description TRACHEAL ASPIRATE  Final   Special Requests Normal  Final   Gram Stain   Final    MODERATE WBC PRESENT, PREDOMINANTLY PMN NO ORGANISMS SEEN    Culture    Final    FEW Consistent with normal respiratory flora. Performed at [facility], [address] [place], [place] [address]    Report Status 03-Feb-2019 FINAL  Final    Lab Basic Metabolic Panel: Recent Labs  Lab 01/05/2019 0129 01/28/2019 0202  NA 142 141  K 4.0 3.6  CL 113*  --   CO2 20*  --   GLUCOSE 118*  --   BUN 20  --   CREATININE 1.03*  --   CALCIUM 8.5*  --    Liver Function Tests: Recent Labs  Lab 01/04/2019 0129  AST 28  ALT 11  ALKPHOS 52  BILITOT 1.8*  PROT 5.2*  ALBUMIN 2.9*   No results for input(s): LIPASE, AMYLASE in the last 168 hours. No results for input(s): AMMONIA in the last 168 hours. CBC: Recent Labs  Lab 01/22/2019 0129 01/28/2019 0202  WBC 3.7*  --   NEUTROABS 3.0  --   HGB 16.0* 14.3  HCT 50.8* 42.0  MCV 91.2  --   PLT 105*  --    Cardiac Enzymes: No results for input(s): CKTOTAL, CKMB, CKMBINDEX, TROPONINI in the last 168  hours. Sepsis Labs: Recent Labs  Lab 01/12/2019 0129  WBC 3.7*    Procedures/Operations  Extubation 01/05/2019   Giovanie Lefebre Mechele Collin 01/28/2019, 5:55 PM

## 2019-01-29 NOTE — Progress Notes (Signed)
Wasted with Elza Rafter, RN in stericycle 81 mL of morphine 100mg  in NS IV bag in stericycle.

## 2019-01-29 NOTE — Progress Notes (Addendum)
Carolyn Torres called back confirming the funeral parlor and that the 2 rings patient are currently wearing will stay on the fingers when body is laid to rest. Will informed Placement Department of daughter's instruction of the 2 rings. [facility] Party of [facility] was made aware of Carolyn Torres' return call and patient's death.

## 2019-01-29 NOTE — Progress Notes (Signed)
Patient expired at 0310 and death confirmed by N. [place] and C. Luvenia Cranford, RNs.  On-call PCCM MD, S. Arsenio Loader was made aware and informed this RN that Dr. Mechele Collin will sign the death certificate.  [facility] was also made aware and patient is potential donor for tissue per [facility] staff Earlie Lou and requested to hold the body.  Will call back Tim as soon as patient's daughter, Cheza Sutliff will return call to this RN.  Left voice mail of RN's contact number.  Attempted to call Lisa x 3.

## 2019-01-29 DEATH — deceased

## 2019-01-31 NOTE — Telephone Encounter (Signed)
Received signed copy of D/C.D/C mailed to [facility] Dept., as requested.

## 2021-01-24 IMAGING — MR MR HEAD W/O CM
11 of 12 series · 43 of 48 positions shown · non-contrast
Comparison: CTA head neck 01/23/2019

CLINICAL DATA: Stroke.  Occlusion of the tip of the basilar artery.

EXAM:
MRI HEAD WITHOUT CONTRAST
TECHNIQUE: Multiplanar, multiecho pulse sequences of the brain and surrounding
structures were obtained without intravenous contrast.

[Series 5: DWI · axial · 3.0mm · 0.88mm/px · z∈[-49,+85]mm · 7 of 91 slices shown (1 of 4)]
[im 1/91]
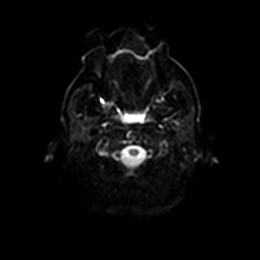
[im 16/91]
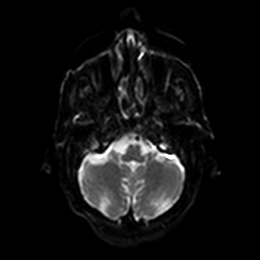
[im 31/91]
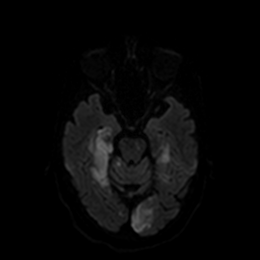
[im 46/91]
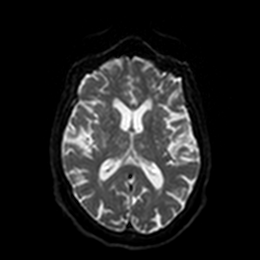
[im 61/91]
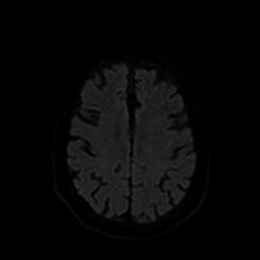
[im 76/91]
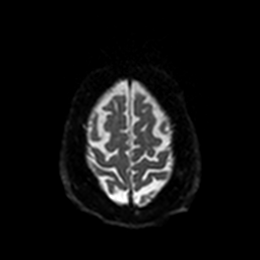
[im 91/91]
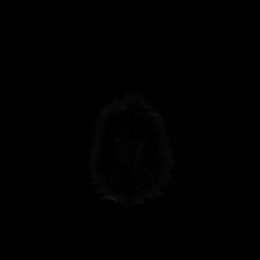

[Series 6: DWI · axial · 3.0mm · 0.88mm/px · z∈[-49,+85]mm · 4 of 46 slices shown (2 of 4)]
[im 1/46]
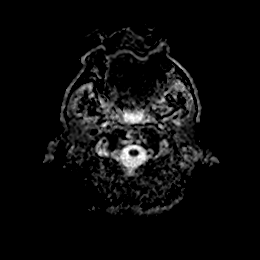
[im 16/46]
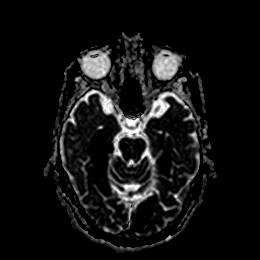
[im 31/46]
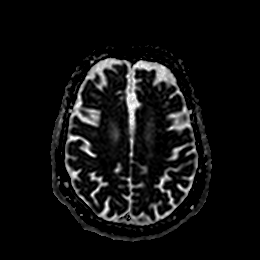
[im 46/46]
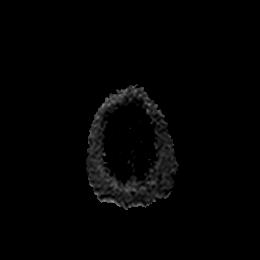

[Series 7: DWI · coronal · 4.0mm · 0.88mm/px · 6 of 68 slices shown (3 of 4)]
[im 1/68]
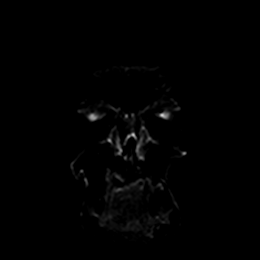
[im 14/68]
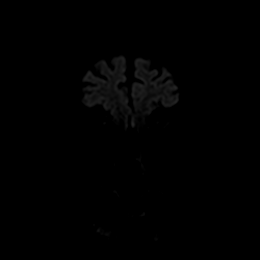
[im 27/68]
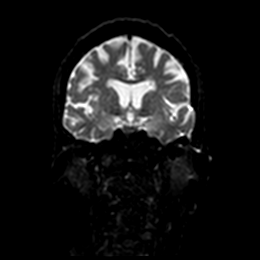
[im 41/68]
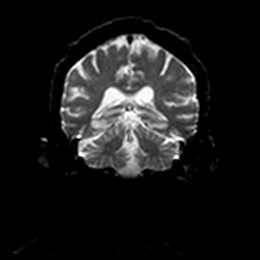
[im 54/68]
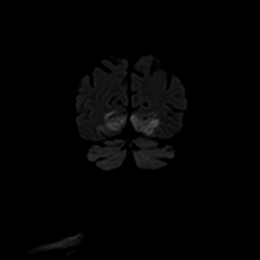
[im 68/68]
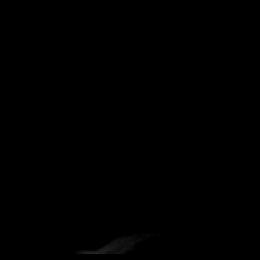

[Series 8: DWI · coronal · 4.0mm · 0.88mm/px · 3 of 33 slices shown (4 of 4)]
[im 1/33]
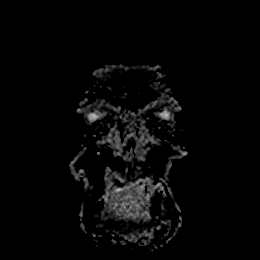
[im 17/33]
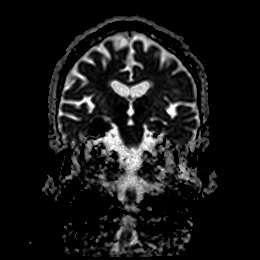
[im 33/33]
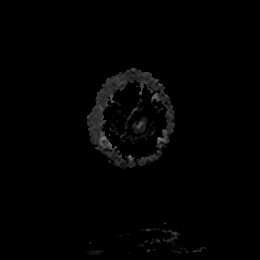

[Series 9: T1 · sagittal · 5.0mm · 0.75mm/px · 2 of 23 slices shown]
[im 1/23]
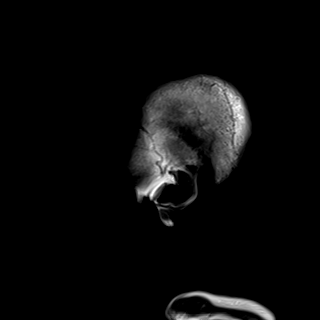
[im 23/23]
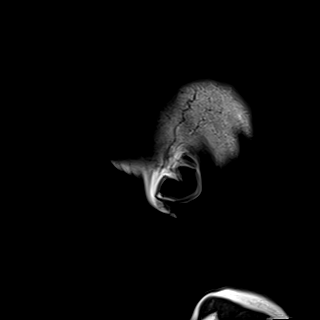

[Series 10: T2 · axial · 5.0mm · 0.72mm/px · z∈[-53,+90]mm · 2 of 25 slices shown (1 of 2)]
[im 1/25]
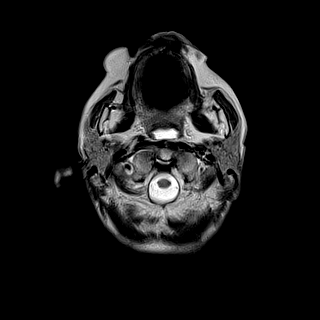
[im 25/25]
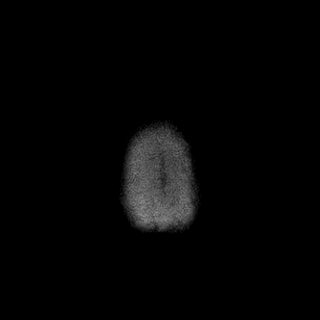

[Series 11: FLAIR · axial · 5.0mm · 0.45mm/px · z∈[-52,+91]mm · 2 of 25 slices shown]
[im 1/25]
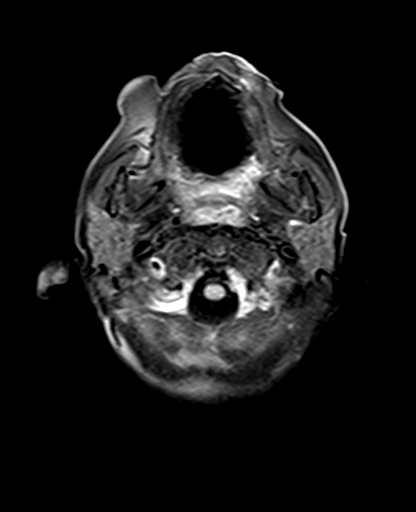
[im 25/25]
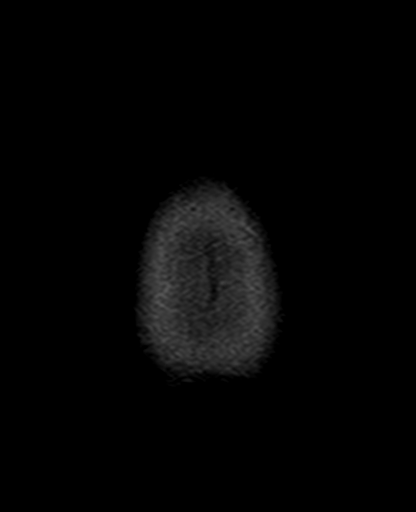

[Series 12: mag_images · axial · 3.0mm · 0.90mm/px · z∈[-69,+108]mm · 5 of 60 slices shown]
[im 1/60]
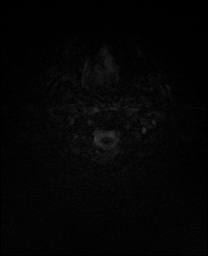
[im 15/60]
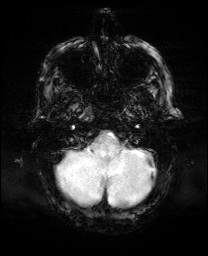
[im 30/60]
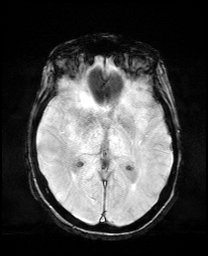
[im 45/60]
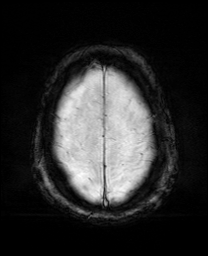
[im 60/60]
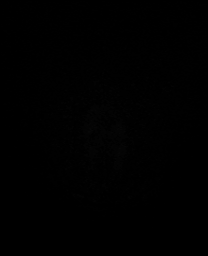

[Series 13: pha_images · axial · 3.0mm · 0.90mm/px · z∈[-69,+108]mm · 5 of 58 slices shown]
[im 1/58]
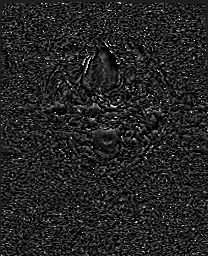
[im 15/58]
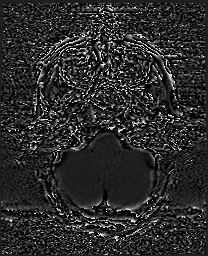
[im 29/58]
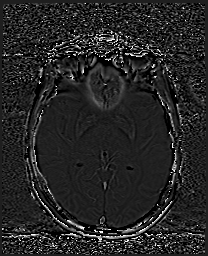
[im 43/58]
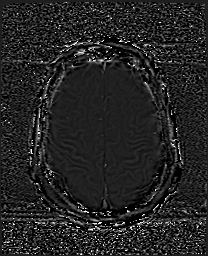
[im 58/58]
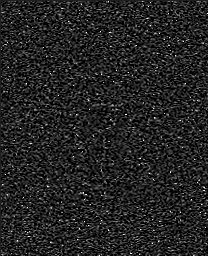

[Series 14: swi_images · axial · 3.0mm · 0.90mm/px · z∈[-69,+108]mm · 5 of 60 slices shown]
[im 1/60]
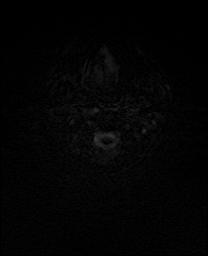
[im 15/60]
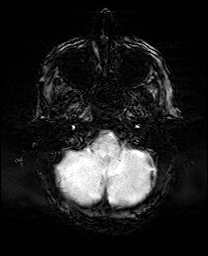
[im 30/60]
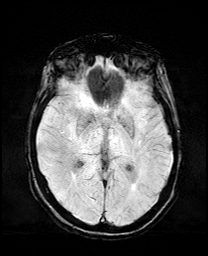
[im 45/60]
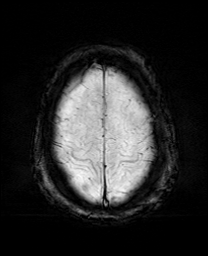
[im 60/60]
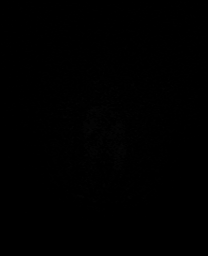

[Series 17: T2 · coronal · 5.0mm · 0.34mm/px · 2 of 29 slices shown (2 of 2)]
[im 1/29]
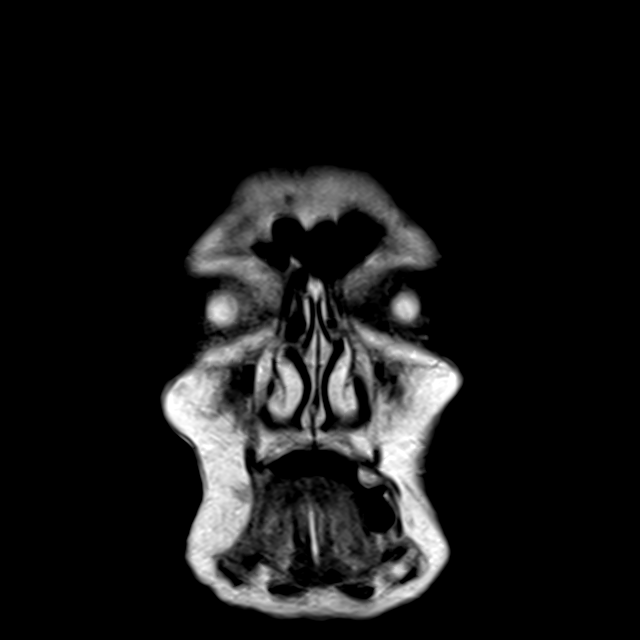
[im 29/29]
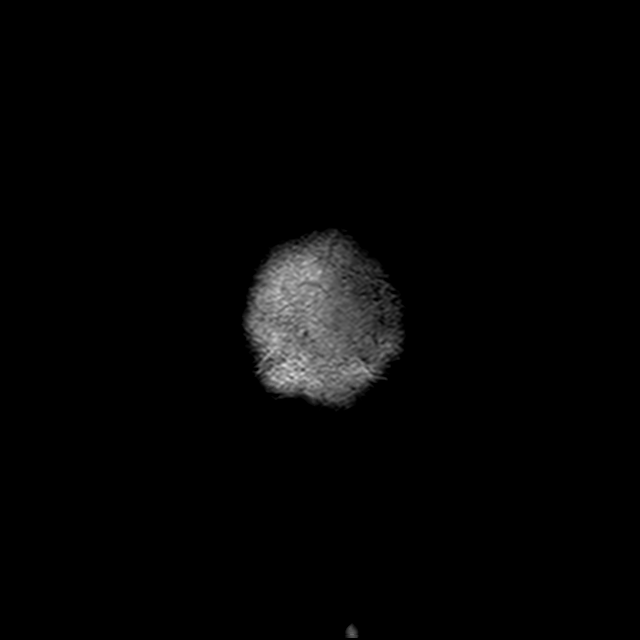

[43 of 48 positions shown; findings below may reference images not displayed]

FINDINGS: BRAIN: There is extensive bilateral posterior circulation acute
ischemia with large areas of ischemic infarct within both medial
temporal lobes and left-greater-than-right occipital lobes.
Bilateral medial thalamic infarcts and multifocal bilateral
cerebellar infarcts as well. No anterior circulation infarct. The
midline structures are normal. No midline shift or other mass
effect. Mild white matter hyperintensity, most commonly due to
chronic ischemic microangiopathy, though not unexpected for age. The
cerebral and cerebellar volume are age-appropriate.
Susceptibility-sensitive sequences show no chronic microhemorrhage
or superficial siderosis. No acute hemorrhage.

VASCULAR: Major intracranial arterial and venous sinus flow voids
are normal.

SKULL AND UPPER CERVICAL SPINE: Calvarial bone marrow signal is
normal. There is no skull base mass. Visualized upper cervical spine
and soft tissues are normal.

SINUSES/ORBITS: Fluid layering within the nasopharynx. Bilateral
mastoid effusions. The orbits are normal.
IMPRESSION: 1. Multifocal bilateral posterior circulation acute infarcts,
greatest in the temporal and occipital lobes with small areas in
both thalami, the brainstem and cerebellum.
2. No mass effect or acute hemorrhage.
3. Chronic ischemic microangiopathy.
